# Patient Record
Sex: Male | Born: 1955 | Race: White | Hispanic: No | Marital: Married | Smoking: Never smoker
Health system: Southern US, Community
[De-identification: ages and names within clinical notes are randomized; demographics above are authoritative.]

---

## 2006-08-15 ENCOUNTER — Ambulatory Visit: Payer: Self-pay | Admitting: Gastroenterology

## 2009-06-25 ENCOUNTER — Encounter: Payer: Self-pay | Admitting: Cardiovascular Disease

## 2009-06-28 ENCOUNTER — Encounter: Payer: Self-pay | Admitting: Cardiovascular Disease

## 2011-09-03 ENCOUNTER — Observation Stay: Payer: Self-pay | Admitting: Internal Medicine

## 2011-09-03 LAB — URINALYSIS, COMPLETE
Bilirubin,UR: NEGATIVE
Ketone: NEGATIVE
RBC,UR: NONE SEEN /HPF (ref 0–5)
Squamous Epithelial: NONE SEEN
WBC UR: 1 /HPF (ref 0–5)

## 2011-09-03 LAB — COMPREHENSIVE METABOLIC PANEL
Alkaline Phosphatase: 64 U/L (ref 50–136)
Calcium, Total: 8.4 mg/dL — ABNORMAL LOW (ref 8.5–10.1)
Chloride: 102 mmol/L (ref 98–107)
Co2: 26 mmol/L (ref 21–32)
Creatinine: 1 mg/dL (ref 0.60–1.30)
EGFR (Non-African Amer.): >60
Glucose: 100 mg/dL — ABNORMAL HIGH (ref 65–99)
Osmolality: 280 (ref 275–301)
SGPT (ALT): 66 U/L
Sodium: 139 mmol/L (ref 136–145)
Total Protein: 6.9 g/dL (ref 6.4–8.2)

## 2011-09-03 LAB — TROPONIN I: Troponin-I: <0.02 ng/mL

## 2011-09-03 LAB — CBC
HCT: 34 % — ABNORMAL LOW (ref 40.0–52.0)
MCV: 72 fL — ABNORMAL LOW (ref 80–100)
RBC: 4.74 10*6/uL (ref 4.40–5.90)
RDW: 14.7 % — ABNORMAL HIGH (ref 11.5–14.5)
WBC: 6.9 10*3/uL (ref 3.8–10.6)

## 2011-09-03 LAB — CK TOTAL AND CKMB (NOT AT ARMC): CK-MB: 1.7 ng/mL (ref 0.5–3.6)

## 2011-09-04 ENCOUNTER — Ambulatory Visit: Payer: Self-pay | Admitting: Orthopaedic Surgery

## 2011-09-04 LAB — BASIC METABOLIC PANEL
BUN: 13 mg/dL (ref 7–18)
Calcium, Total: 8 mg/dL — ABNORMAL LOW (ref 8.5–10.1)
Chloride: 105 mmol/L (ref 98–107)
Creatinine: 0.93 mg/dL (ref 0.60–1.30)
EGFR (African American): >60
EGFR (Non-African Amer.): >60
Glucose: 111 mg/dL — ABNORMAL HIGH (ref 65–99)
Osmolality: 291 (ref 275–301)
Potassium: 3.6 mmol/L (ref 3.5–5.1)
Sodium: 146 mmol/L — ABNORMAL HIGH (ref 136–145)

## 2011-09-04 LAB — CBC WITH DIFFERENTIAL/PLATELET
Eosinophil #: 0 10*3/uL (ref 0.0–0.7)
Eosinophil %: 0.9 %
HCT: 30.5 % — ABNORMAL LOW (ref 40.0–52.0)
Lymphocyte #: 1.3 10*3/uL (ref 1.0–3.6)
MCH: 23.3 pg — ABNORMAL LOW (ref 26.0–34.0)
MCV: 71 fL — ABNORMAL LOW (ref 80–100)
Monocyte #: 0.4 10*3/uL (ref 0.0–0.7)
Monocyte %: 8.3 %
Neutrophil #: 3.4 10*3/uL (ref 1.4–6.5)
Platelet: 139 10*3/uL — ABNORMAL LOW (ref 150–440)
RBC: 4.28 10*6/uL — ABNORMAL LOW (ref 4.40–5.90)
WBC: 5.2 10*3/uL (ref 3.8–10.6)

## 2011-09-04 LAB — TROPONIN I
Troponin-I: <0.02 ng/mL
Troponin-I: <0.02 ng/mL

## 2011-09-04 LAB — IRON AND TIBC
Iron Bind.Cap.(Total): 291 ug/dL (ref 250–450)
Iron Saturation: 28 %
Iron: 82 ug/dL (ref 65–175)
Unbound Iron-Bind.Cap.: 209 ug/dL

## 2011-09-04 LAB — HEMOGLOBIN: HGB: 9.6 g/dL — ABNORMAL LOW (ref 13.0–18.0)

## 2011-09-04 LAB — CK TOTAL AND CKMB (NOT AT ARMC)
CK, Total: 160 U/L (ref 35–232)
CK, Total: 163 U/L (ref 35–232)

## 2011-12-28 ENCOUNTER — Inpatient Hospital Stay: Payer: Self-pay | Admitting: Internal Medicine

## 2011-12-28 LAB — DIFFERENTIAL
Basophil #: 0 10*3/uL (ref 0.0–0.1)
Basophil %: 0.2 %
Eosinophil %: 0.1 %
Lymphocyte #: 1.4 10*3/uL (ref 1.0–3.6)
Lymphocyte %: 16.8 %
Monocyte %: 6.5 %
Neutrophil #: 6.2 10*3/uL (ref 1.4–6.5)
Neutrophil %: 76.4 %

## 2011-12-28 LAB — BASIC METABOLIC PANEL
Anion Gap: 7 (ref 7–16)
Calcium, Total: 7.9 mg/dL — ABNORMAL LOW (ref 8.5–10.1)
Chloride: 103 mmol/L (ref 98–107)
Co2: 25 mmol/L (ref 21–32)
Creatinine: 0.95 mg/dL (ref 0.60–1.30)
Potassium: 3.9 mmol/L (ref 3.5–5.1)
Sodium: 135 mmol/L — ABNORMAL LOW (ref 136–145)

## 2011-12-28 LAB — CBC
HGB: 10.3 g/dL — ABNORMAL LOW (ref 13.0–18.0)
MCH: 23.3 pg — ABNORMAL LOW (ref 26.0–34.0)
MCHC: 32.9 g/dL (ref 32.0–36.0)
Platelet: 158 10*3/uL (ref 150–440)
RBC: 4.41 10*6/uL (ref 4.40–5.90)

## 2011-12-28 LAB — TROPONIN I: Troponin-I: <0.02 ng/mL

## 2011-12-29 LAB — CBC WITH DIFFERENTIAL/PLATELET
Basophil #: 0 10*3/uL (ref 0.0–0.1)
Basophil #: 0 10*3/uL (ref 0.0–0.1)
Basophil %: 0.2 %
Eosinophil %: 0.1 %
HCT: 26.7 % — ABNORMAL LOW (ref 40.0–52.0)
HGB: 8.8 g/dL — ABNORMAL LOW (ref 13.0–18.0)
Lymphocyte #: 1.3 10*3/uL (ref 1.0–3.6)
Lymphocyte %: 24.6 %
Lymphocyte %: 9.9 %
MCHC: 33 g/dL (ref 32.0–36.0)
MCV: 70 fL — ABNORMAL LOW (ref 80–100)
Monocyte %: 8.2 %
Monocyte %: 6.4 %
Neutrophil #: 8.2 10*3/uL — ABNORMAL HIGH (ref 1.4–6.5)
Neutrophil %: 66.7 %
Neutrophil %: 83.5 %
Platelet: 140 10*3/uL — ABNORMAL LOW (ref 150–440)
RBC: 3.85 10*6/uL — ABNORMAL LOW (ref 4.40–5.90)
RDW: 15.2 % — ABNORMAL HIGH (ref 11.5–14.5)
RDW: 15.2 % — ABNORMAL HIGH (ref 11.5–14.5)
WBC: 5.2 10*3/uL (ref 3.8–10.6)
WBC: 9.8 10*3/uL (ref 3.8–10.6)

## 2011-12-29 LAB — BASIC METABOLIC PANEL
BUN: 23 mg/dL — ABNORMAL HIGH (ref 7–18)
EGFR (Non-African Amer.): >60
Glucose: 93 mg/dL (ref 65–99)
Potassium: 3.7 mmol/L (ref 3.5–5.1)
Sodium: 141 mmol/L (ref 136–145)

## 2011-12-29 LAB — FERRITIN: Ferritin (ARMC): 12 ng/mL (ref 8–388)

## 2011-12-29 LAB — IRON AND TIBC
Iron Bind.Cap.(Total): 301 ug/dL (ref 250–450)
Iron Saturation: 38 %
Iron: 115 ug/dL (ref 65–175)

## 2011-12-30 LAB — BUN: BUN: 11 mg/dL (ref 7–18)

## 2011-12-30 LAB — HEMOGLOBIN: HGB: 8.2 g/dL — ABNORMAL LOW (ref 13.0–18.0)

## 2011-12-31 LAB — HEMOGLOBIN: HGB: 8.4 g/dL — ABNORMAL LOW (ref 13.0–18.0)

## 2012-01-07 ENCOUNTER — Ambulatory Visit: Payer: Self-pay | Admitting: Gastroenterology

## 2012-01-11 LAB — PATHOLOGY REPORT

## 2012-10-27 IMAGING — CT CT HEAD WITHOUT CONTRAST
2 series · 16 of 30 positions shown, 20 images · non-contrast
Comparison: none

REASON FOR EXAM: syncope
COMMENTS:

PROCEDURE:     CT  - CT HEAD WITHOUT CONTRAST  - September 03, 2011  [DATE]
RESULT:     Technique: Helical 5mm sections were obtained from the skull
base to the vertex without administration of intravenous contrast.

[Series 2: without · axial · non-contrast · 0.42mm/px · z∈[-136,-16]mm · 13 of 29 slices shown, 17 images]
[im 3/29  brain]
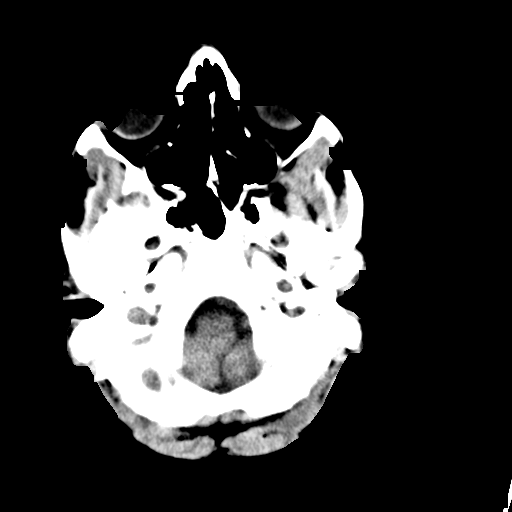
[im 3/29  bone]
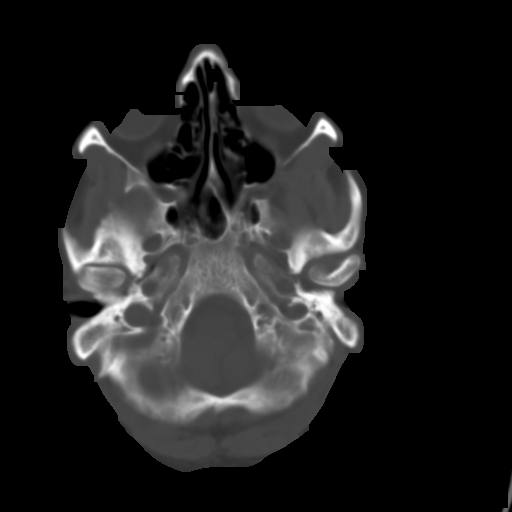
[im 5/29  brain]
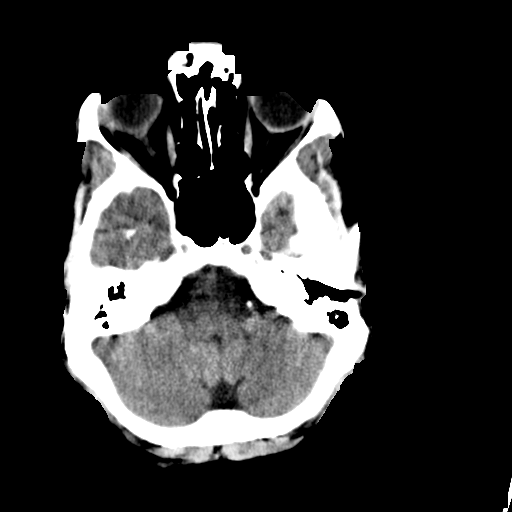
[im 7/29  brain]
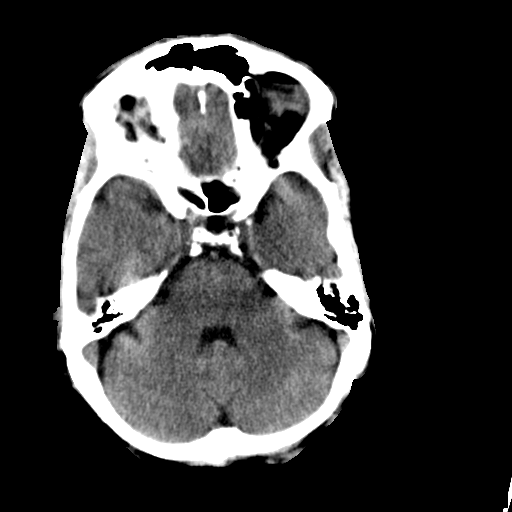
[im 9/29  brain]
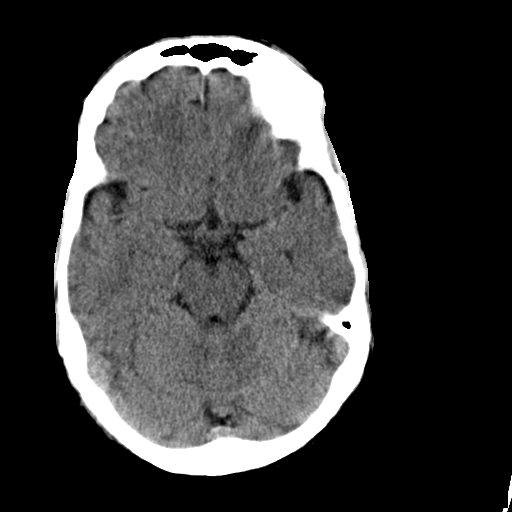
[im 11/29  brain]
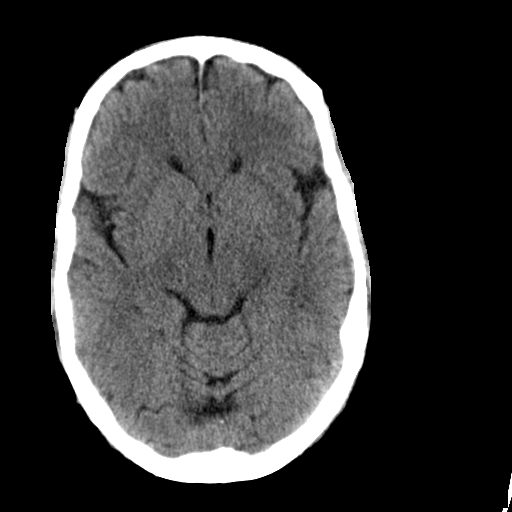
[im 11/29  bone]
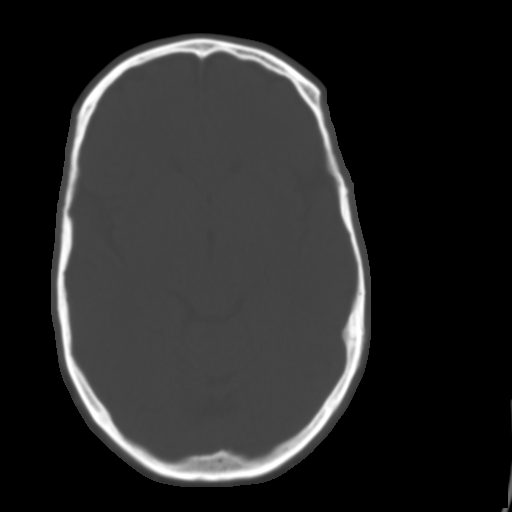
[im 13/29  brain]
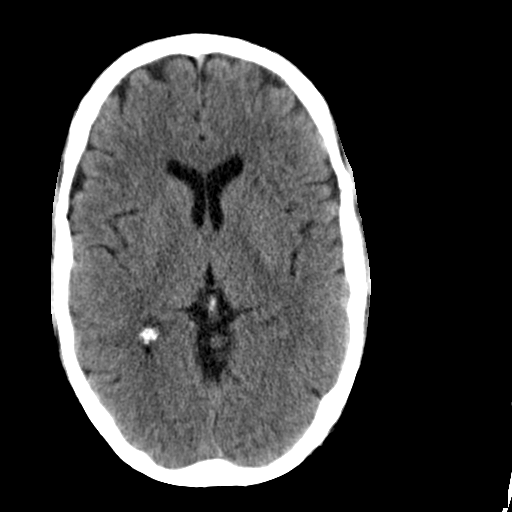
[im 15/29  brain]
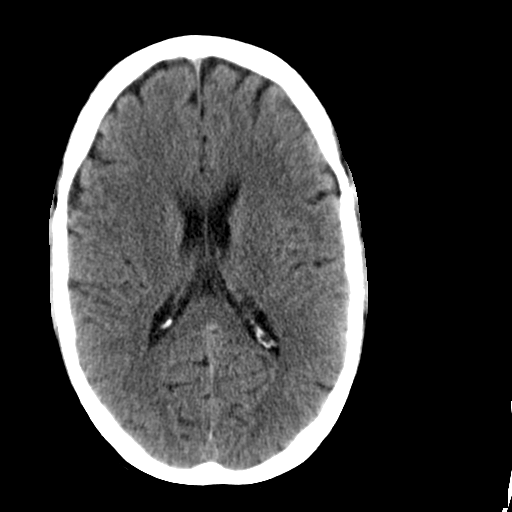
[im 17/29  brain]
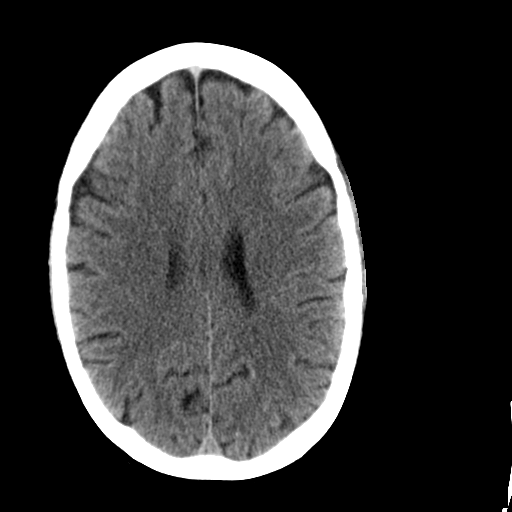
[im 19/29  brain]
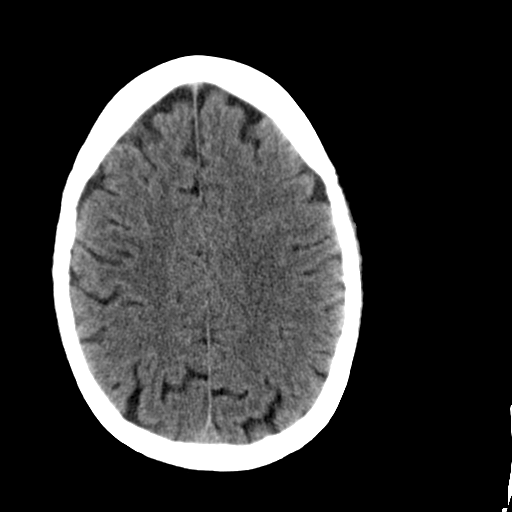
[im 19/29  bone]
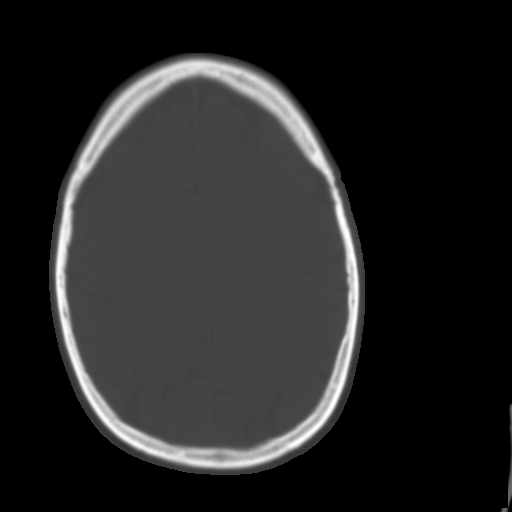
[im 21/29  brain]
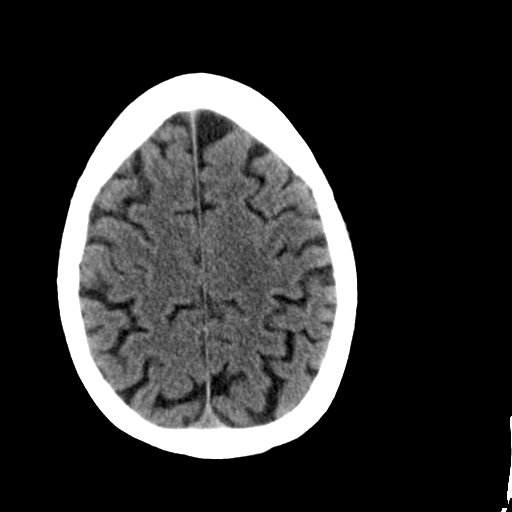
[im 23/29  brain]
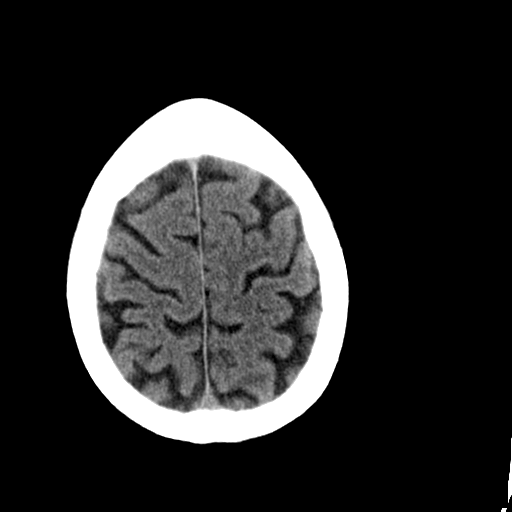
[im 25/29  brain]
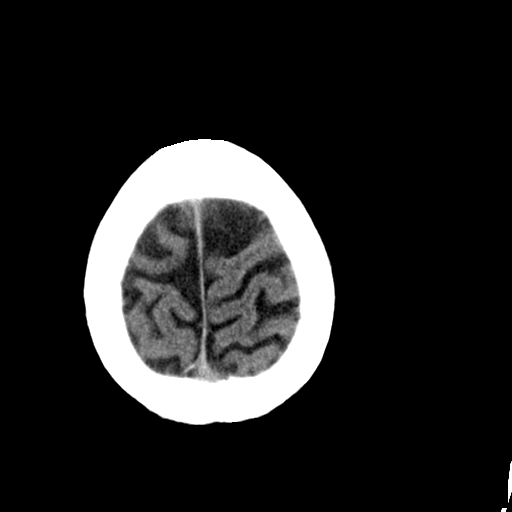
[im 27/29  brain]
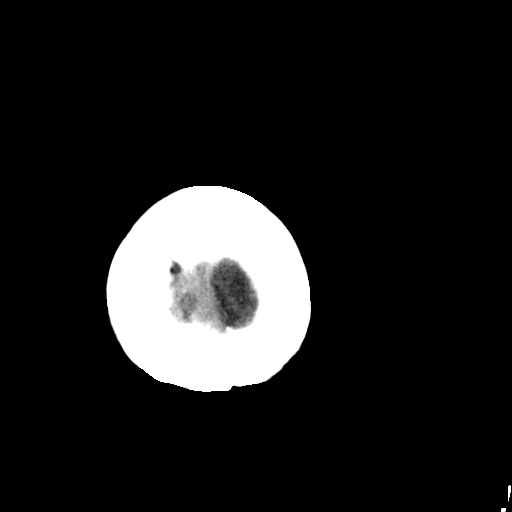
[im 27/29  bone]
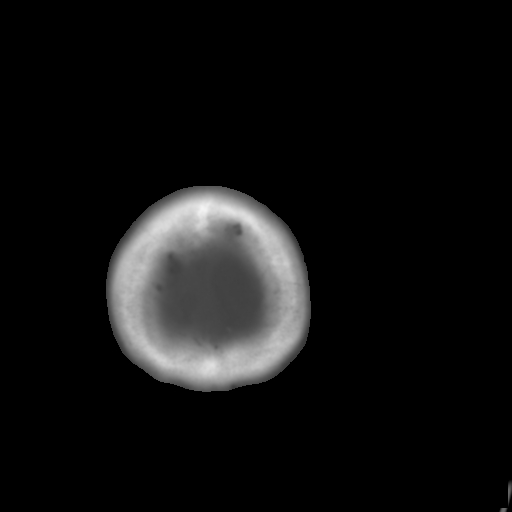

[Series 3: bone · axial · 0.42mm/px · z∈[-136,-96]mm · 3 of 29 slices shown]
[im 3/29  bone]
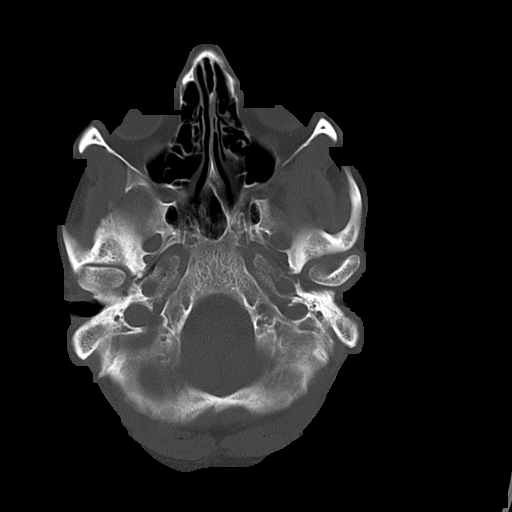
[im 7/29  bone]
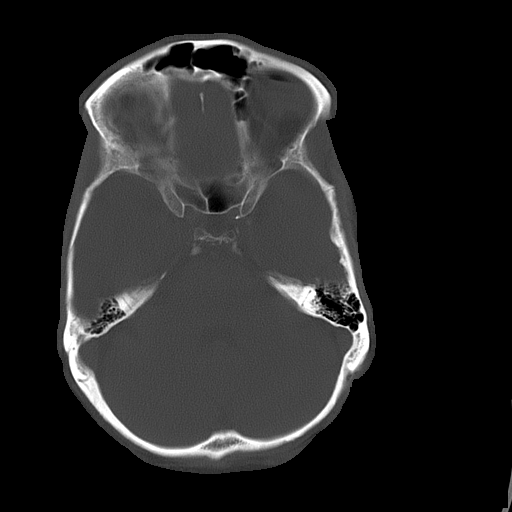
[im 11/29  bone]
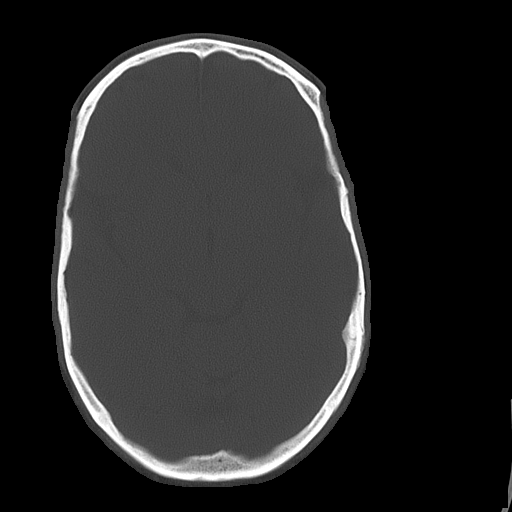

[16 of 30 positions shown; findings below may reference images not displayed]

FINDINGS: There is not evidence of intra-axial fluid collections. There is
no evidence of acute hemorrhage or secondary signs reflecting mass effect or
subacute or chronic focal territorial infarction. The osseous structures
demonstrate no evidence of a depressed skull fracture. If there is
persistent concern clinical follow-up with MRI is recommended.
IMPRESSION: 1. No evidence of acute intracranial abnormalitites.

## 2014-10-20 NOTE — Consult Note (Signed)
Brief Consult Note: Diagnosis: Bruise R thigh.   Patient was seen by consultant.   Consult note dictated.   Comments: Syncopal fall and injury to bruise the R thigh.  No other injury noted and no head trauma.  Exam : NVI RLE with noted weakness on R quad function due to pain.  Bruise over anterior medial thigh.  Knee ROM is full and painless.  Sensation intact distally.  Able to maintain straight leg raise with some discomfort in the quad.  XR:  Normal R femur and R knee with no signs of fracture or other bony trauma  A: deep bruise to R thigh  P:  WBAT, crutches for comfort, pain control, ice as needed.  fu prn.  Electronic Signatures: Otilio SaberSikes, Cricket Goodlin V (MD)  (Signed 09-Mar-13 15:49)  Authored: Brief Consult Note   Last Updated: 09-Mar-13 15:49 by Otilio SaberSikes, Deloma Spindle V (MD)

## 2014-10-20 NOTE — Consult Note (Signed)
Chief Complaint:   Subjective/Chief Complaint Further review of chart done.  Patietn on effient (prasugrel).  Patietn has been hemodynamically stable, decreased evidence of bleeding, BUN improved.  Effient has a longer halflife than plavix.  With patietn current without evidence of ongoing bleeding, will hold EGD for now, unless there is recurrent bleeding of a significant nature.  I [facility] patient's [facility] [phone]).   He agrees with holding effient, though would like to have him on the 81 asa.  Will continue to hold both for now, at least for the nex 5 days.  Continue ppi, change to iv bid tomorrow, the po bid the following day.  Ice chips for today.  EGD in 5-7 days unless clinical change.  Discussed with Dr Camillo FlamingKamran Lateef.   Electronic Signatures: Barnetta ChapelSkulskie, Laira Penninger (MD)  (Signed 03-Jul-13 14:18)  Authored: Chief Complaint   Last Updated: 03-Jul-13 14:18 by Barnetta ChapelSkulskie, Samyiah Halvorsen (MD)

## 2014-10-20 NOTE — Discharge Summary (Signed)
PATIENT NAME:  Daniel Boone, Daniel Boone MR#:  [id number] DATE OF BIRTH:  August 10, 1955  DATE OF ADMISSION:  09/03/2011 DATE OF DISCHARGE:  09/04/2011  ADMITTING DIAGNOSIS: Recurrent syncope.   DISCHARGE DIAGNOSES: 1. Recurrent syncope of unclear etiology, likely related TO orthostatic hypotension.  2. Orthostatic hypotension, dehydration, resolved with intravenous fluids.  3. Right knee pain and swelling after fall and injury, soft tissue hematoma.  4. Anemia.  5. Elevated transaminases.   DISCHARGE CONDITION: Fair.   DISCHARGE MEDICATION: Patient is to resume his outpatient medications which are: Lipitor 10 mg p.o. at bedtime.   ADDITIONAL MEDICATION: Aspirin 81 mg p.o. daily.   NOTE: Patient was advised not to take his metoprolol or Effient until recommended by his primary care physician or cardiologist.   HOME OXYGEN: None.   DIET: 2 grams salt, low fat, low cholesterol.   ACTIVITY LIMITATIONS: As tolerated.   DISCHARGE INSTRUCTIONS: Patient was advised by orthopedic surgeon, Dr. Verlon SettingSikes, weight-bearing as tolerated, crutches for comfort. Pain control as well as ice as needed and follow up with orthopedic surgeon, Dr. Verlon SettingSikes, as needed. Patient is also to follow up with his primary care physician, Dr. Harrington Challengerhies, in two days after discharge as well as his cardiologist, [facility] in two days after discharge for possible stress test.   CONSULTANT: Dr. Crist Infanteharles Sikes, orthopedic surgeon.   LABORATORY, DIAGNOSTIC AND RADIOLOGICAL DATA: Femur right x-ray on 09/03/2011 showed no evidence of acute abnormalities. Right knee completed x-ray 09/04/2011 showed no evidence of acute abnormalities. CT of head without contrast 09/03/2011 showed no evidence of acute intracranial abnormalities. CT of chest for pulmonary embolism with IV contrast 09/03/2011 revealed no CT evidence of pulmonary arterial embolic disease. Mediastinum as well hilar regions and structures demonstrate extensive  coronary artery vascular calcifications. There was no evidence of mediastinal masses or adenopathy. There was no evidence of filling defects in main lobar as well as segmental pulmonary arteries. Lung parenchyma demonstrated no evidence of focal infiltrates, effusions, edema, masses or nodules. Visualized upper abdominal viscera demonstrated no gross abnormalities.   HISTORY OF PRESENT ILLNESS: Patient is a 59 year old Caucasian male with past medical history significant for history of coronary artery disease as well as hyperlipidemia. Apparently was sitting on the sofa and whenever he got up one day before admission he felt lightheaded and he blacked out. He syncopized. Patient's wife heard crash in the kitchen. She found him, however, leaning on the counter and he also had injured with fall with right lower extremity. He was a little confused according to medical records afterwards. On day of admission he felt okay except having some pain in his right thigh. It was difficult to walk, however, he still went to work. He felt okay all day long, however, at 4:00 p.m. 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 Apparently he had few episodes of syncope the same day and presented to the hospital for further evaluation. Vital signs ion the day of admission showed blood pressure 107/78, pulse oximetry was normal at 99% on room air, pulse 61, respiration rate 16. Physical examination was unremarkable. Patient has some swelling in right thigh as well as knee, however, no lower extremity edema was otherwise noted from the knee down.   LABORATORY, DIAGNOSTIC AND RADIOLOGICAL DATA: His EKG showed sinus brady at 56 beats per minute, normal axis. No acute ST-T changes were noted. Patient's lab data done on day of admission showed glucose level 100 otherwise BMP was unremarkable.  Patient's liver enzymes showed AST elevation to 54, otherwise unremarkable liver enzymes. Patient's cardiac enzymes first set  as well as subsequent two more sets were within normal limits. CBC was within normal limits, however, patient's hemoglobin level was somewhat low at 11.0 with normal platelet count of 156 and low MCV at 72. Patient's d-dimer was elevated at 1.57. Urinalysis was unremarkable. Patient's ABGs showed pH 7.42, pCO2 46, pO2 87, saturation 97% on room air.   HOSPITAL COURSE: Patient was admitted to the hospital. He had CT scan of his head as well as x-rays of his right thigh as well as knee. He had CT scan of his chest because of concerns of possible deep venous thrombosis and pulmonary embolism. He was admitted to telemetry. His cardiac enzymes were cycled and there were no noted abnormalities. His telemetry also showed no arrhythmias. He was ambulated. As he did not have any more symptoms he was discharged. It was felt that patient's syncope could have been related to orthostatic hypotension which was noted on the day of admission. In fact, patient's orthostatics revealed significant drop of blood pressure. Overall patient's blood pressure was somewhat low ranging from 90s to 108 systolic, however, whenever he stood up early in the morning 09/04/2011 patient's blood pressure plummeted down even lower from 90 to 80s systolic and it was felt that orthostatic hypotension could have been related to his fall. It was unclear why patient would have orthostatic hypotension unless he was somewhat dehydrated which he stated could have been related to his exercise and significant sweating and poor fluid intake. It was also concerning that patient could have had bradycardia episode and that bradycardia was responsible for his orthostatic hypotension otherwise patient was somewhat over blocked with beta blockers and his heart rate would not to increase whenever his blood pressure is somewhat low. For this reason patient was advised to stop his beta blocker at this point and follow up with his primary care physician as well as  cardiologist for further recommendations. Patient was rehydrated with IV fluids and patient's orthostatic vital signs improved. His heart rate also improved from 50s to 65 on day of discharge, 09/04/2011. Patient was also noted to be anemic. With rehydration patient's hemoglobin level trended down. He also had some subcutaneous and deep tissue injury of his right thigh. It was felt that he probably had mild hematoma and because of that hematoma Effient was placed on hold. Patient is, however, to continue aspirin therapy for his known history of coronary artery disease. Patient was evaluated by orthopedic surgeon, Dr. Verlon Setting, who felt that patient had right lower extremity weakness and right quadriceps function due to pain. He also was noted to have bruise over anteromedial thigh, however, patient's knee range of motion was full as well as painless. Sensation was also intact distally. Patient was able to maintain straight leg raise with some discomfort in quadriceps. His x-rays were normal in femur as well as right knee with no signs of fracture or bony trauma. He felt that patient had deep bruise of right thigh and recommended to continue weight bearing as tolerated as well as crutches for comfort and pain control as well as ice as needed. Patient was, as mentioned above, ambulated in the hospital and because he was asymptomatic it was felt that patient is safe to be discharged. His vital signs on the day of discharge showed temperature of 98, pulse 65, respiration rate 18, blood pressure 97/61, oxygen saturation was stable at 98% on room  air at rest. Patient is to continue his medications for coronary artery disease only with aspirin as well as Lipitor, however, patient's Effient and beta blockers were placed on hold as mentioned above because of incomplete heart response to hypertension which could have contributed to his syncope episode as well as anemia. He is to continue Lipitor for his hyperlipidemia. In  regards to elevated transaminases which were noted on admission, patient's AST was mildly elevated. It was felt that could have been related to Lipitor use. It is recommended to follow patient's liver enzymes as well as lipid panel as needed. Patient did not have lipid panel checked in the hospital. He had iron studies done in the hospital and iron binding capacity was found to be 291. Patient's iron saturation was 28. Patient is being discharged with above-mentioned medications and follow up.    TIME SPENT: 40 minutes.   ____________________________ Katharina Caper, MD rv:cms D: 09/04/2011 19:46:52 ET T: 09/05/2011 10:20:18 ET JOB#: [id number]  cc: Katharina Caper, MD, <Dictator> Neomia Dear. Harrington Challenger, MD Dr. Chip Boer, [facility] cardiology Aldrick Derrig Winona Legato MD ELECTRONICALLY SIGNED 09/05/2011 16:12

## 2014-10-20 NOTE — Discharge Summary (Signed)
PATIENT NAME:  Daniel Boone, Daniel Boone MR#:  [id number] DATE OF BIRTH:  1956-05-19  DATE OF ADMISSION:  12/28/2011 DATE OF DISCHARGE:  12/31/2011  PRIMARY CARE PHYSICIAN: Mickey Farber, MD  REASON FOR ADMISSION: Dizziness and black stools.   DISCHARGE DIAGNOSES:  1. Melena with concern for possible upper gastrointestinal bleed.  2. Acute posthemorrhagic anemia suspected from upper gastrointestinal bleed.  3. Dizziness/presyncope due to hypotension likely from gastrointestinal blood loss and anemia.  4. Orthostatic hypotension, resolved. 5. History of coronary artery disease status post myocardial infarction and multiple coronary stents.  6. History of hypertension. 7. History of syncope due to orthostatic hypotension.   CONSULTANTS:  1. Barnetta Chapel, MD - Gastroenterology. 2. Lurline Del, MD - Gastroenterology.  3. Dorothyann Peng, MD - Cardiology.  DISCHARGE DISPOSITION: Home.   DISCHARGE CONDITION: Improved, stable.   DISCHARGE MEDICATIONS:  1. Protonix 40 mg p.o. twice a day. 2. Atorvastatin 80 mg p.o. daily.  3. Tylenol 325 mg 1 to 2 tablets p.o. every 8 hours p.r.n. pain.   DISCHARGE ACTIVITY: As tolerated.   DISCHARGE DIET: Mechanical, soft easy to digest until followup with Dr. Marva Panda and diet should be low sodium, low fat, low cholesterol.   DISCHARGE INSTRUCTIONS:  1. Take medications as prescribed.  2. Return to the emergency department for recurrence of symptoms or for development of any dark or black stools or abdominal pain, nausea and vomiting or for dizziness, lightheadedness or feeling faint or for any chest pain or shortness of breath.  3. Do not take Effient or Prasugrel  unless further advised to do so by your cardiologist and/or your gastroenterologist. 4. Do not take aspirin or any NSAIDs including ibuprofen, Advil, Aleve, naproxen, BC or Goody's Powders or Mobic or meloxicam until or unless further advised to do so by your cardiologist and/or  gastroenterologist and/or your primary care physician.  5. Do not take any blood pressure medications for now until/unless further advised to do so by Dr. Harrington Challenger or your cardiologist.  6. Do not consume any alcohol until/unless further advised to do so by hour gastroenterologist.   FOLLOWUP INSTRUCTIONS:  1. Follow up with Dr. Marva Panda within one week. The patient needs repeat hemoglobin and hematocrit check within one week. 2. Followup with your cardiologist in 1 to 2 weeks. 3. Followup with Dr. Harrington Challenger within 1 to 2 weeks. The patient needs repeat blood pressure check within one week including orthostatic vital signs.   PROCEDURES: None.   PERTINENT LABORATORY DATA: CBC normal on admission except for hemoglobin 8.4 and hematocrit 31.3. Hemoglobin is 8.4 on the day of discharge and was 8.2 on the day prior to discharge.   Troponin less than 0.02 on admission.    Renal function normal on admission, but BUN elevated at 38 and BUN is 11 from 12/30/2011.  Iron panel: Serum iron 115, TIBC 301, iron saturation 38, and ferritin 12.   BRIEF HISTORY/HOSPITAL COURSE: The patient is a very pleasant 59 year old male with past medical history of hypertension and coronary artery disease status post myocardial infarction and multiple coronary stents (involving the LAD and RCA) with history of syncope due to orthostatic hypotension who presented to the Emergency Department with complaints of dizziness and melena. Please see dictated admission history and physical for pertinent details surrounding the onset of this hospitalization and please see below for further details.  1. Melena - with concern for upper GI bleed likely aspirin and Effient induced. Initially these medications were held. The patient was started on volume resuscitation  with IV fluids and IV Protonix drip and thereafter admitted to the hospital for further evaluation and management. We continued to check his hemoglobin and hematocrit on a serial  basis. He did have some anemia upon presentation. He had orthostatic hypotension on arrival which likely accounted for his dizziness. After hydration with IV fluids his orthostasis has resolved as did his hypotension. Blood pressure medications have been on hold since admission. Although the patient has lost some blood and had acute posthemorrhagic anemia which is suspected from upper GI bleed, his overall condition has improved and he was felt to have stabilized with eventual resolution of his melena and eventual resolution of his dizziness, lightheadedness, and presyncope and he did not require blood transfusion as he had improved symptomatically and otherwise had remained hemodynamically stable after the above-mentioned measures. He is not significantly iron deficient. Gastroenterology consultation was obtained and Dr. Marva Panda plans on performing EGD as an outpatient once Effient has had a chance to adequately clear from the patient's system. Dr. Marva Panda also discussed the case with the patient's primary cardiologist and risks and benefits were discussed and at this time given significant bleeding risks with aspirin and Effient, the patient's primary cardiologist has advised to hold aspirin and Effient for now until the patient undergoes endoscopy. Thereafter depending findings further decisions will be provided to the patient in regards to antiplatelet treatment and whether to resume aspirin or Effient or both or neither. The patient was also seen in consultation by our cardiologist, Dr. Juliann Pares, at your hospital, and Dr. Juliann Pares was in agreement with holding aspirin and Effient for now until the patient undergoes EGD, which be performed as an outpatient. The patient does understand the risk of myocardial infarction while holding aspirin and Effient and verbalizes understanding of these risks and accepts risks at this time including MI and death and wishes to continue to hold aspirin and Effient for now.   2. For coronary artery disease status post myocardial infarction and multiple coronary artery stents, he will continue statin for now and hold aspirin and Effient until given further instructions by his gastroenterologist and cardiologist.  3. Initially the patient was on a clear liquid diet. Once it was determined that his endoscopy would be performed in the outpatient setting rather than as an inpatient, his diet was transitioned from solids to liquids. He is tolerating a solid diet well at the time of discharge without any complications. His melena has resolved. His dizziness and presyncope have resolved. His orthostatic hypotension has resolved. He has also been transitioned off IV Protonix drip onto oral Protonix which he will take at home on a twice a day basis until further advised by gastroenterology. The patient was advised to continue to hold aspirin and Effient for now until he is given further instructions by his gastroenterologist and cardiologist. He was also advised to avoid alcohol consumption for now as he stated that he drinks beer and drinks at least one beer per evening and sometimes two beers in the weekends.  4. In regards to dizziness and presyncope with hypotension with orthostasis, this is suspected to be from GI blood loss and anemia. After volume resuscitation with IV fluids, his dizziness, presyncope, and orthostatic hypotension have resolved. His melena has also resolved. He is not on any blood pressure medications at home. The patient will need close outpatient follow up with his primary care physician within one week for blood pressure check and the patient was advised to return to the ER immediately if  he experiences any dizziness, lightheadedness, weakness, fatigue, lethargy, chest pain, shortness of breath, or for any recurrence of melena or any abdominal pain, nausea or vomiting to which the patient verbally agreed.   On 12/31/2011, when I rounded on the patient, he was  hemodynamically stable and without any complaints whatsoever and without any melena or dizziness and he is felt to be medically stable for discharge home with close outpatient follow-up to which the patient was agreeable and is also felt to be safe from gastroenterology and cardiology perspectives to have the patient discharged home.   TIME SPENT ON DISCHARGE: Greater 30 minutes. ____________________________ Elon AlasKamran N. Marinell Igarashi, MD knl:slb D: 01/04/2012 18:54:00 ET T: 01/05/2012 13:31:15 ET JOB#: [id number]  cc: Elon AlasKamran N. Axie Hayne, MD, <Dictator> Neomia Dearavid N. Harrington Challengerhies, MD Christena DeemMartin U. Skulskie, MD Elon AlasKAMRAN N Ekta Dancer MD ELECTRONICALLY SIGNED 01/07/2012 17:36

## 2014-10-20 NOTE — Consult Note (Signed)
PATIENT NAME:  Daniel Boone, Sandy MR#:  [id number] DATE OF BIRTH:  30-Jun-1955  DATE OF CONSULTATION:  09/04/2011  REFERRING PHYSICIAN:   CONSULTING PHYSICIAN:  Collier Bullockharles V. Calen Geister, MD  HISTORY OF PRESENT ILLNESS: The patient is a 59 year old male who sustained a syncopal fall the day prior to this evaluation. He was admitted to the observation unit in the hospital and orthopedics has been consulted regarding bruising and tenderness over his anterior thigh due to trauma that he sustained in his syncopal fall. The patient states that he had no other injury and did not strike his head during his fall. He is now ready for discharge following his syncopal workup and has some questions regarding the expected recovery and rehab for his right thigh bruising.   PAST MEDICAL HISTORY: 1. 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  2. Hyperlipidemia.   PAST SURGICAL HISTORY: Coronary artery stent.   ALLERGIES: None.   MEDICATIONS: Please see the electronic medical record.  SOCIAL HISTORY: The patient teaches high school. He drinks a small amount of alcohol on a daily basis. He does not smoke or use other drugs.   FAMILY HISTORY: Father pharyngeal cancer and carotid artery stenosis. Mother with history of breast cancer.   REVIEW OF SYSTEMS: Negative as dictated in the history of present illness.   PHYSICAL EXAMINATION: The patient is alert and oriented in no acute distress. Normal mood and affect. The right lower extremity is evaluated and found to be grossly neurovascularly intact. Does have some observed bruising over the anterior medial aspect of his thigh. There is mild tenderness to palpation over the medial aspect of his knee and the medial aspect of his thigh. His knee range of motion is normal from 0 to 120. His knee is stable to varus valgus, anterior and posterior stress. There is no evidence of ligamentous injury. He is able to complete a straight leg raise and hold in extended position with mild  associated discomfort with tenderness in his quadriceps. Sensation is intact distally over the medial, lateral and dorsal aspects of his foot. EHL, FHL, dorsal flexion, plantar flexion, motor function are intact. It is warm and well perfused.   LABORATORY, DIAGNOSTIC AND RADIOLOGICAL DATA: X-ray of the right femur and right knee demonstrate no fracture or other signs of acute injury. Overall alignment is maintained.   ASSESSMENT: 59 year old male status post syncopal fall with bruising to the right thigh and quadriceps area.   PLAN: The patient can continue to be weight bearing as tolerated. He can continue to use ice for comfort. He can have crutches for comfort but has been instructed not to immobilize the knee as this may lead to excessive stiffness. He should continue to use the knee as able. Patient has been instructed that for such a deep appearing bruise and apparent inhibition of his quadriceps due to hematoma it is likely that this will require protracted course of conservative management once he is able to walk normally and feels like his pain is resolving, he can initiate a course of quadriceps strengthening exercises as demonstrated in his room today. Will plan to see the patient back on an as needed basis.   ____________________________ Collier Bullockharles V. Naydelin Ziegler, MD cvs:cms D: 09/04/2011 15:55:33 ET T: 09/04/2011 16:04:35 ET JOB#: [id number]  cc: Collier Bullockharles V. Ramaya Guile, MD, <Dictator> Otilio SaberHARLES V Chinedu Agustin MD ELECTRONICALLY SIGNED 09/21/2011 14:52

## 2014-10-20 NOTE — H&P (Signed)
PATIENT NAME:  Daniel Boone, Daniel Boone MR#:  [id number] DATE OF BIRTH:  1955-09-23  DATE OF ADMISSION:  09/03/2011  CHIEF COMPLAINT:  Passing out.    HISTORY OF PRESENT ILLNESS: This is a 59 year old man who last night fell asleep on the sofa sitting up, got up and felt a little lightheaded, went to sit down and ended up blacking out. His wife heard a crash in the kitchen. She found him leaning up on the counter, I guess trying to hold up some things that may have fell. He did bang up his leg at that time. There was a little confusion afterwards. This morning he got up. He felt well except for the pain in his thigh. It was hard to walk. He went to work. Did well all day and felt well all day. At 4:00 p.m. he was getting a haircut. He felt faint. He blacked out again. He came through and he drank some water and then he felt faint again and blacked out again. They noticed some convulsion but there was no loss of bowel or bladder function and no tongue biting, and no confusion when he came through.  He did vomit up some water after he came through. The patient was feeling well after coming into the hospital and feels well now. Does have pain in his right thigh. Hospitalist services was contacted for further evaluation for repeated syncope.   PAST MEDICAL HISTORY: STEMI in 2010 requiring a stent in the LAD and 3 more stents the next day.  Hyperlipidemia.  History of passing out with donating blood.   PAST SURGICAL HISTORY: None.   ALLERGIES: No known drug allergies.   MEDICATIONS:  1. Metoprolol XL 12.5 mg at bedtime.  2. Lipitor 80 mg at bedtime.  3. Effient 10 mg at bedtime.  4. Aspirin 81 mg at bedtime.  5. Metamucil p.r.n.   SOCIAL HISTORY: Teaches high school. He does have a pint of beer or a glass of wine per day. No alcohol. No drug use.   FAMILY HISTORY: Father died at 55, had pharyngeal cancer, also had carotid artery blockage. Mother 60, healthy. History of breast cancer.   REVIEW OF SYSTEMS:  CONSTITUTIONAL: Positive for hot and sweaty during the episodes today. Weight stable over the past year but had some weight loss in 2010. No fever or chills. No weakness. Eyes: He does wear glasses. Ears, nose, mouth, and throat: Positive for sore throat. No hearing loss. No runny nose. CARDIOVASCULAR: No chest pain. No palpitations. RESPIRATORY: Positive for cough. No sputum. No hemoptysis. No shortness of breath. GASTROINTESTINAL: Positive for vomiting after a syncopal episode. No abdominal pain. No bright red blood per rectum. No melena. Sometimes hemorrhoids do bleed. GENITOURINARY: No burning on urination or hematuria. MUSCULOSKELETAL: No joint pain or muscle pain. INTEGUMENT: No rashes or eruptions. NEUROLOGIC: Positive for blacking out. PSYCHIATRIC: No anxiety or depression. ENDOCRINE: No thyroid problems. HEMATOLOGIC/LYMPHATIC: No anemia.   PHYSICAL EXAMINATION:  VITAL SIGNS: Blood pressure on presentation 107/78, pulse oximetry 99%, pulse 61, respirations 16.   GENERAL: No respiratory distress.   HEENT:  Eyes: Conjunctivae and lids normal. Pupils equal, round, and reactive to light. Extraocular muscles intact. No nystagmus. Ears, nose, mouth, and throat: Tympanic membranes no erythema. Nasal mucosa no erythema. Throat no erythema. No exudate seen. Lips and gums no lesions.   NECK: No JVD. No bruits. No lymphadenopathy. No thyromegaly. No thyroid nodules palpated.   RESPIRATORY: Lungs clear to auscultation. No use of accessory muscles to breathe. No  rhonchi, rales, or wheeze heard.   CARDIOVASCULAR SYSTEM: S1, S2 normal. No gallops, rubs, or murmurs heard. Carotid upstroke 2+ bilaterally. No bruits. Dorsalis pedis pulses 2+ bilaterally. Positive swelling of the right thigh and knee.   ABDOMEN: Soft, nontender. No organosplenomegaly. Normoactive bowel sounds. No masses felt.   LYMPHATIC: No lymph nodes in the neck.   MUSCULOSKELETAL: Swelling of the right thigh and knee. No lower extremity  down the ankle swelling either leg. No clubbing. No cyanosis.   SKIN: Positive for hematoma of the right thigh and bruising.   NEUROLOGIC: Cranial nerves II through XII grossly intact. Deep tendon reflexes 1+ bilateral lower extremities.   PSYCHIATRIC: The patient is oriented to person, place, and time.   LABORATORY AND RADIOLOGICAL DATA: D-dimer 1.57. Troponin negative. White blood cell count 6.9, hemoglobin 11.0 and hematocrit 34.0, platelet count of 156,000. Glucose 100, BUN 18, creatinine 1.0, sodium 139, potassium 3.8, chloride 102, CO2 of 26, calcium 8.4. Liver function tests: AST slightly elevated at 54, other liver function tests normal. CT scan of the head showed no acute intracranial abnormalities. Right femur showed no fracture. CT scan of the chest was done with contrast. No pulmonary embolism. EKG shows a sinus bradycardia at 57 beats per minute.   ASSESSMENT AND PLAN:  1. Syncope with history of coronary artery disease.  I will admit as an observation, put on telemetry, get serial cardiac enzymes, give IV fluid hydration. Seems more vasovagal with pain in the thigh and vomiting. He does give a history of convulsion which was short. He had no loss of bowel or bladder function, no tongue bite, and was not confused when he came through so I doubt this is a seizure. Since he feels well we will observe overnight. Hopefully we will be able to discharge in the a.m.  2. Hematoma of the right thigh from the first syncopal episode. Will hold Effient and aspirin at this point since he has a hematoma there.  We will give an Ace bandage, p.r.n. Vicodin. Can consider outpatient orthopedic consultation if does not improve.  3. Hyperlipidemia. Continue Lipitor. Check a lipid profile in the a.m.   TIME SPENT ON ADMISSION:  55 minutes.   ____________________________ Herschell Dimesichard J. Renae GlossWieting, MD rjw:vtd D: 09/03/2011 22:30:43 ET   T: 09/04/2011 06:35:23 ET   JOB#: [id number] cc: Herschell Dimesichard J. Renae GlossWieting, MD,  <Dictator> Salley ScarletICHARD J Tyheim Vanalstyne MD ELECTRONICALLY SIGNED 09/04/2011 14:20

## 2014-10-20 NOTE — Consult Note (Signed)
Chief Complaint:   Subjective/Chief Complaint No BM. Denies any other significant symptoms.   VITAL SIGNS/ANCILLARY NOTES: **Vital Signs.:   04-Jul-13 07:53   Vital Signs Type Routine   Temperature Temperature (F) 98.2   Celsius 36.7   Temperature Source oral   Pulse Pulse 68   Pulse source if not from Vital Sign Device per cardiac monitor   Systolic BP Systolic BP 95   Diastolic BP (mmHg) Diastolic BP (mmHg) 56   Mean BP 69   Systolic BP Systolic BP 95   Diastolic BP (mmHg) Diastolic BP (mmHg) 56   Pulse Lying Pulse Lying 68   Systolic BP Systolic BP 86   Diastolic BP (mmHg) Diastolic BP (mmHg) 42   Pulse Pulse Sitting 60   Systolic BP Systolic BP 90   Diastolic BP (mmHg) Diastolic BP (mmHg) 54   Pulse Standing Pulse Standing 85   Pulse Ox % Pulse Ox % 98   Pulse Ox Activity Level  At rest   Oxygen Delivery Room Air/ 21 %   Brief Assessment:   Additional Physical Exam Awake and alert. No distress. Abdomen is soft and benign.   Lab Results: Routine Chem:  04-Jul-13 04:26    BUN 11 (Result(s) reported on 30 Dec 2011 at 05:38AM.)  Routine Hem:  04-Jul-13 04:26    Hemoglobin (CBC)  8.2 (Result(s) reported on 30 Dec 2011 at 05:36AM.)   Assessment/Plan:  Assessment/Plan:   Assessment Melena, resolved. H and H stable after a mild drop. No signs of active GI bleed.    Plan Continue IV PPI. Full liquid diet. If no active bleeding, may be able to go home tomorrow and have OP EGD next week as scheduled.   Electronic Signatures: Lurline DelIftikhar, Jerremy Maione (MD)  (Signed 04-Jul-13 11:45)  Authored: Chief Complaint, VITAL SIGNS/ANCILLARY NOTES, Brief Assessment, Lab Results, Assessment/Plan   Last Updated: 04-Jul-13 11:45 by Lurline DelIftikhar, Teddrick Mallari (MD)

## 2014-10-20 NOTE — Consult Note (Signed)
PATIENT NAME:  Daniel Boone, Daniel Boone MR#:  [id number] DATE OF BIRTH:  06/19/1956  DATE OF CONSULTATION:  12/29/2011  REFERRING PHYSICIAN:  Dr. Luberta MutterKonidena CONSULTING PHYSICIAN:  Christena DeemMartin U. Fayez Sturgell, MD  REASON FOR CONSULTATION: Black stool/melena.   HISTORY OF PRESENT ILLNESS: Daniel Boone is a [age] year old Caucasian male with a history of coronary artery disease. He has "four or five" stents. He has not had coronary artery bypass grafting. His last stent was placed in 2010, about 2-1/2 years ago. One of these per paperwork was a drug-eluting stent. He has been on 81-mg aspirin as well as Effient. He stated that this past March he had some episodes of syncope for which he was admitted to the hospital, and evaluation was done. There was no determined etiology. He was then seen again by his cardiologist with a Holter monitor and echocardiogram being done, these being normal. He last saw his cardiologist on 09/24/2011. He does have a history of ST-elevated myocardial infarction in 2010. He was recently taken off of metoprolol. Yesterday, he developed some lightheadedness and he had some black stools in the morning. He had a low blood pressure and was seen by his M.D. a couple days ago. The lightheadedness then was also followed with some nausea but no abdominal pain. He has had no nausea otherwise. He had a single episode of some emesis of clear material yesterday. He has had increased indigestion symptoms over the past week for which he has been taking some TUMS. His last bowel movement was last night. He has been hemodynamically stable. He has no history of peptic ulcer disease. He states his last dose of Effient and aspirin was a day and a half ago. He denies currently any nausea, vomiting, or abdominal pain. He generally has a bowel movement daily. Again, his last bowel movement was last night.   PAST MEDICAL HISTORY:  1. History of hypertension and coronary artery disease as above.  2. History of syncope with  possible orthostatic hypotension as noted above.  3. History of elevated transaminases in the past.  4. He has had a colonoscopy in the past. That procedure was done 08/15/2006. That procedure did show several polyps that were removed as well as some diverticulosis and some small nonbleeding internal hemorrhoids. The polyps were noted as being hyperplastic, not adenomatous.   ALLERGIES: There no known drug allergies.   OUTPATIENT MEDICATIONS:  1. Aspirin 81 mg daily.  2. Atorvastatin 80 mg once a day.  3. Effient 10 mg once a day.   REVIEW OF SYSTEMS: Syncopal/presyncopal episodes as noted above, undetermined etiology. Negative for other systems.   PHYSICAL EXAMINATION:  VITAL SIGNS: Temperature 98.3, pulse 66, respirations 16, blood pressure 91/54, pulse oximetry 98%.   GENERAL: He is a 59 year old Caucasian male in no acute distress.   HEAD: Normocephalic, atraumatic.   EYES: Anicteric.   NOSE: Septum midline. No lesions.   OROPHARYNX: No lesions.   NECK: Supple. No JVD. No lymphadenopathy. No thyromegaly.   HEART: Regular rate and rhythm without rub or gallop.   LUNGS: Clear.   ABDOMEN: Soft, nontender, and nondistended. Bowel sounds positive, normoactive.   RECTAL: Anorectal exam deferred. Black stools noted yesterday.   EXTREMITIES: No clubbing, cyanosis, or edema.   NEUROLOGICAL: Cranial nerves II through XII grossly intact. Muscle strength bilaterally equal and symmetric, five out of five. Deep tendon reflexes bilaterally equal and symmetric.   LABORATORY, DIAGNOSTIC, AND RADIOLOGICAL DATA: On admission to the hospital he had a BUN of 38, creatinine 0.95,  sodium 135, potassium 3.9, chloride 103, bicarbonate 25, calcium 7.9, troponin I times one less than 0.02. Hemogram showing white count of 8.4, hemoglobin/hematocrit 10.3/31.3, platelet count 158, MCV 71.  EKG showing normal sinus rhythm, inferior infarct age undetermined. This would be consistent with his history. A  repeat hemogram at 02:00 hours this morning showed his hemoglobin/hematocrit  at 8.9/27, platelet count 140.   ASSESSMENT: Black stool/melena. The patient has been hemodynamically stable and has had no repeat episode to indicate ongoing bleeding since last night. Review of his medications indicates that he is not only on aspirin but also a long-acting antiplatelet agent Effient. His stent was done about 2-1/2 years ago. He has been doing relatively well since then with the episodes of syncope as noted. In discussion with his primary cardiologist, his last hemoglobin at that office about three months ago was 13.8.   RECOMMENDATIONS:  1. Continue to hold aspirin and Effient. In discussion with his cardiologist, he agrees with discontinuing the Effient, although we would like to continue him on aspirin once he is beyond this acute episode. Plans to possibly do an EGD this afternoon. I have discussed the risks, benefits, and complications of EGD to include but not limited to bleeding, infection, perforation, and the risk of sedation and he wishes to proceed.  2. Continue IV PPI drip as you are. N.p.o.  3. Further recommendations to follow.   ADDENDUM: As noted, I did call and discuss the case with his primary cardiologist this afternoon. He did feel that it was fine to hold the Effient.  In regards to his current situation, he has been hemodynamically stable without evidence of recurrent GI bleeding. As such, I feel we can continue to treat him empirically,  allow the anticoagulant to wash out of his system a bit more before doing a scope. Should he, however, show evidence of repeat bleeding, scope more urgently will need to be done. I have discussed this with Dr. Camillo Flaming as well as the patient and his wife. Would recommend further observation in-house for two days or so. Hold diet until tomorrow. Slow advancement from clear liquids through full liquids to low residue. Would continue IV PPI as you are  currently, tomorrow switching to b.i.d. IV and then on to b.i.d. oral. Continue to hold Effient.  Would consider restarting the aspirin in five or so days.    ____________________________ Christena Deem, MD mus:bjt D:  12/29/2011 14:34:40 ET          T: 12/29/2011 15:06:40 ET         JOB#: [id number]  cc: Christena Deem, MD, <Dictator> Christena Deem MD ELECTRONICALLY SIGNED 01/08/2012 4:34

## 2014-10-20 NOTE — Consult Note (Signed)
Brief Consult Note: Diagnosis: melena, symptomatic anemia.   Patient was seen by consultant.   Consult note dictated.   Recommend to proceed with surgery or procedure.   Orders entered.   Discussed with Attending MD.   Comments: Patient seen and examined, full consult to follow. 59 yo w/m with h/o MI/multiple coronary stents on effient and asa admitted last night with synptomatic anemia, black stools.  No recurrent stool since last night.  The last dose of the asa and effient over 36 hours ago.  Now on iv ppi drip.  minimal to no abdominal discomfort.  Will arrange for egd later today.  I have discussed the risks benefits and complications of egd to include not limited to bleeding infection perforation and sedation and he wishes to proceed.  Continue current.  Electronic Signatures: Barnetta ChapelSkulskie, Martin (MD)  (Signed 03-Jul-13 08:31)  Authored: Brief Consult Note   Last Updated: 03-Jul-13 08:31 by Barnetta ChapelSkulskie, Martin (MD)

## 2014-10-20 NOTE — H&P (Signed)
PATIENT NAME:  Daniel Boone, Daniel Boone MR#:  [id number] DATE OF BIRTH:  1956/03/17  DATE OF ADMISSION:  12/28/2011  PRIMARY CARE PHYSICIAN: Dr. Harrington Challenger    ER PHYSICIAN: Dr. Shaune Pollack    CHIEF COMPLAINT: Dizziness and black stools.   HISTORY OF PRESENT ILLNESS: The patient is a 59 year old male with history of coronary artery disease on aspirin and Effient since 2010 who came in because the patient had a fainting spell at home and blood pressure was 75/55. The patient went to his primary doctor because of persistent dizziness and hypotension. The patient had also noticed black stools and this morning stools were checked and they were Hemoccult positive. Because of his hypotension, dizziness, and black stools he was sent to the Emergency Room for GI bleed. The patient says that he never had black stools and denies abdominal pain. No burning. Does not take any ibuprofen or Advil but takes only aspirin 81 mg daily and Effient 10 mg daily for the past three years. The patient right now feels okay. Denies any chest pain or trouble breathing. Feels somewhat dizzy when he has to go to the bathroom. The patient so far had about five black stools this morning.   PAST MEDICAL HISTORY: 1. History of hypertension. 2. Coronary artery disease. 3. The patient was admitted in March for syncope. At that time it was thought that he had syncope secondary to orthostatic hypotension. He was admitted on March 8th and March 9th.   4. The patient also has history of elevated transaminases before.   ALLERGIES: No known allergies.   SOCIAL HISTORY: No smoking. Occasional alcohol. No drugs.   PAST SURGICAL HISTORY: History of coronary artery disease stents, two in LAD and three in right RCA. Dr. Juliann Pares is his cardiologist. The patient also had some oral surgeries.   FAMILY HISTORY: Paternal grandfather had colon cancer. No history of hypertension or diabetes.   MEDICATIONS:  1. Aspirin 81 mg daily. 2. Atorvastatin 80 mg daily.   3. Effient 10 mg p.o. daily.  4. The patient was on metoprolol but not taking it anymore.   REVIEW OF SYSTEMS: CONSTITUTIONAL: Feels weak. EYES: No blurred vision. ENT: No tinnitus. No epistaxis. No difficulty swallowing. RESPIRATORY: No trouble breathing. CARDIOVASCULAR: No chest pain. No palpitations. The patient was dizzy this morning. GI: Had black stools since this morning. No constipation. No history of gastroesophageal reflux disease. No nausea or vomiting. No diarrhea. ENDOCRINE: No polyuria or nocturia. INTEGUMENTARY: No skin rashes. MUSCULOSKELETAL: Denies any joint pains. PSYCH: No anxiety or insomnia   PHYSICAL EXAMINATION:   VITAL SIGNS: Temperature 97.7, pulse 83, respirations 18, blood pressure 140/81, sats 98% on room air.   GENERAL: Alert, awake, oriented, answering questions appropriately.   HEENT: Extraocular movements intact. No scleral icterus. No conjunctivitis. Hearing is intact. Pharynx is not congested. Mucosa dry. Dentition is good.   NECK: Thyroid is nontender, not enlarged, supple. No masses. No lymphadenopathy. No JVD. No carotid bruit.   RESPIRATORY: Clear to auscultation. No wheeze. No rales.   CARDIOVASCULAR: S1, S2 regular. No murmurs.   ABDOMEN: Soft, nontender, nondistended. Bowel sounds present. No hernias.   MUSCULOSKELETAL: Strength 5/5 in upper and lower extremities.   SKIN: No skin rashes. Skin is warm and dry.   NEUROLOGIC: Cranial nerves II through XII intact. Deep tendon reflexes 2+ bilaterally.    PSYCH: Mood and affect are normal. Oriented to time, place, person.   LABORATORY DATA: WBC 8.4, hemoglobin 10.3, hematocrit 31.3, platelets 158. Electrolytes sodium 135, potassium  3.9, chloride 103, bicarb 25, BUN 38, creatinine 0.95, glucose 98. Troponin is less than 0.02. The patient's hemoglobin on March 9th was 9.6. In March he also had iron studies and they were all within normal limits. On March 8th hemoglobin was 11, hematocrit 34.   EKG  showed normal sinus, 53 beats per minute. No ST-T changes.   ASSESSMENT AND PLAN:  1. This is a 59 year old male with black stools with hypotension for one day. The patient has been on aspirin and Effient. Probably he has upper GI bleed, likely peptic ulcer disease secondary to being on aspirin and Effient. Stop the aspirin and Effient. Continue the Protonix. Spoke with Dr. Marva PandaSkulskie who is on call for GI. He recommended starting Protonix drip so he was started on Protonix drip and started on clear liquids for possible EGD tomorrow. Continue IV fluids because had hypotension before. Right now blood pressure is stable. Watch him on telemetry.  2. Coronary artery disease. At this time stop aspirin and Effient because of GI bleed. Will consult Dr. Juliann Paresallwood.  3. Hyperlipidemia, on statins. Continue that.   TIME SPENT ON HISTORY AND PHYSICAL: About 60 minutes.   ____________________________ Katha HammingSnehalatha Jamani Bearce, MD sk:drc D: 12/28/2011 20:09:02 ET T: 12/29/2011 06:57:17 ET JOB#: [id number]  cc: Katha HammingSnehalatha Nazeer Romney, MD, <Dictator> Neomia Dearavid N. Harrington Challengerhies, MD Katha HammingSNEHALATHA Evangelynn Lochridge MD ELECTRONICALLY SIGNED 01/04/2012 9:59

## 2017-05-10 ENCOUNTER — Ambulatory Visit: Admit: 2017-05-10 | Payer: BC Managed Care – PPO | Admitting: Gastroenterology

## 2017-05-10 SURGERY — COLONOSCOPY WITH PROPOFOL
Anesthesia: General

## 2023-09-25 ENCOUNTER — Ambulatory Visit
Admission: EM | Admit: 2023-09-25 | Discharge: 2023-09-25 | Disposition: A | Discharge status: Home / Self Care | Attending: Emergency Medicine | Admitting: Emergency Medicine

## 2023-09-25 ENCOUNTER — Encounter: Payer: Self-pay | Admitting: Emergency Medicine

## 2023-09-25 DIAGNOSIS — Z20822 Contact with and (suspected) exposure to covid-19: Secondary | ICD-10-CM | POA: Diagnosis present

## 2023-09-25 DIAGNOSIS — J069 Acute upper respiratory infection, unspecified: Secondary | ICD-10-CM | POA: Insufficient documentation

## 2023-09-25 LAB — SARS CORONAVIRUS 2 BY RT PCR: SARS Coronavirus 2 by RT PCR: NEGATIVE

## 2023-09-25 MED ORDER — FLUTICASONE PROPIONATE 50 MCG/ACT NA SUSP: 2.0000 | Freq: Every day | NASAL | 0 refills | Status: AC

## 2023-09-25 NOTE — Discharge Instructions (Addendum)
 Your COVID is negative.    Saline nasal irrigation with a NeilMed sinus rinse and distilled water as often as you want, Flonase and continue Mucinex for the nasal congestion.  This will help prevent a bacterial sinus infection.  Have fun at the wedding and on your bike this week.

## 2023-09-25 NOTE — ED Triage Notes (Signed)
 Patient states that his wife has COVID.  Patient c/o cough and runny nose that started earlier last week.  Patient denies fevers.

## 2023-09-25 NOTE — ED Provider Notes (Signed)
 HPI  SUBJECTIVE:  Daniel Jean. is a 68 y.o. male who presents with close exposure to multiple people with COVID last week.  He has had a dry cough for the past 5 weeks after having an upper respiratory infection, he states that it feels as if it is more in his chest now.  He reports nasal congestion, yellow rhinorrhea and postnasal drip.  No fevers, body aches, headaches, sinus pain or pressure, sore throat, wheezing, chest pain, shortness of breath, nausea, vomiting, diarrhea, abdominal pain.  He is able to sleep at night without waking up coughing.  He tried Zicam, Mucinex this morning without improvement in his symptoms.  No aggravating factors.  No antipyretic in the past 6 hours.  No antibiotics in the past 3 months.  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  [facility].    History reviewed. No pertinent past medical history.  History reviewed. No pertinent surgical history.  History reviewed. No pertinent family history.  Social History   Tobacco Use   Smoking status: Never   Smokeless tobacco: Never  Vaping Use   Vaping status: Never Used  Substance Use Topics   Alcohol use: Not Currently   Drug use: Never    No current facility-administered medications for this encounter.  Current Outpatient Medications:    aspirin EC 81 MG tablet, Take 81 mg by mouth daily., Disp: , Rfl:    atorvastatin (LIPITOR) 40 MG tablet, Take 40 mg by mouth daily., Disp: , Rfl:    fluticasone (FLONASE) 50 MCG/ACT nasal spray, Place 2 sprays into both nostrils daily., Disp: 16 g, Rfl: 0  No Known Allergies   ROS  As noted in HPI.   Physical Exam  BP 132/81 (BP Location: Left Arm)   Pulse (!) 50   Temp 98.5 F (36.9 C) (Oral)   Resp 15   Ht 5\' 4"  (1.626 m)   Wt 61.2 kg   SpO2 97%   BMI 23.17 kg/m   Constitutional: Well developed, well nourished, no acute distress Eyes: PERRL, EOMI, conjunctiva normal bilaterally HENT:  Normocephalic, atraumatic,mucus membranes moist.  Positive nasal congestion.  Normal turns.  No maxillary, frontal sinus tenderness.  No postnasal drip. Respiratory: Clear to auscultation bilaterally, no rales, no wheezing, no rhonchi Cardiovascular: Normal rate and rhythm, no murmurs, no gallops, no rubs GI: nondistended skin: No rash, skin intact Musculoskeletal: no deformities Neurologic: Alert & oriented x 3, CN III-XII grossly intact, no motor deficits, sensation grossly intact Psychiatric: Speech and behavior appropriate   ED Course   Medications - No data to display  Orders Placed This Encounter  Procedures   SARS Coronavirus 2 by RT PCR (hospital order, performed in [facility] hospital lab) *cepheid single result test* Anterior Nasal Swab    Standing Status:   Standing    Number of Occurrences:   1   Results for orders placed or performed during the hospital encounter of 09/25/23 (from the past 24 hours)  SARS Coronavirus 2 by RT PCR (hospital order, performed in [facility] hospital lab) *cepheid single result test* Anterior Nasal Swab     Status: None   Collection Time: 09/25/23  8:25 AM   Specimen: Anterior Nasal Swab  Result Value Ref Range   SARS Coronavirus 2 by RT PCR NEGATIVE NEGATIVE   No results found.  ED Clinical Impression  1. Upper respiratory tract infection, unspecified type   2. Lab test negative for COVID-19 virus  3. Contact with and (suspected) exposure to covid-19      ED Assessment/Plan     COVID-negative.  Discussed with patient while in department.  Saline nasal irrigation, Flonase, continue Mucinex.  There is no evidence of bacterial sinus infection.  Seriously doubt pneumonia, thus chest x-ray was deferred.  He declined prescription of antitussives.  Follow-up with PCP as needed.  Discussed labs,  MDM, treatment plan, and plan for follow-up with patient. patient agrees with plan.   Meds ordered this encounter  Medications    fluticasone (FLONASE) 50 MCG/ACT nasal spray    Sig: Place 2 sprays into both nostrils daily.    Dispense:  16 g    Refill:  0      *This clinic note was created using Scientist, clinical (histocompatibility and immunogenetics). Therefore, there may be occasional mistakes despite careful proofreading. ?    Domenick Gong, MD 09/25/23 [phone]
